# Patient Record
Sex: Female | Born: 1960 | Race: Black or African American | Hispanic: No | Marital: Single | Smoking: Never smoker
Health system: Southern US, Community
[De-identification: ages and names within clinical notes are randomized; demographics above are authoritative.]

---

## 2013-07-26 ENCOUNTER — Emergency Department (HOSPITAL_COMMUNITY): Payer: No Typology Code available for payment source

## 2013-07-26 ENCOUNTER — Emergency Department (HOSPITAL_COMMUNITY)
Admission: EM | Admit: 2013-07-26 | Discharge: 2013-07-26 | Disposition: A | Discharge status: Home / Self Care | Payer: No Typology Code available for payment source | Attending: Emergency Medicine | Admitting: Emergency Medicine

## 2013-07-26 ENCOUNTER — Encounter (HOSPITAL_COMMUNITY): Payer: Self-pay | Admitting: Emergency Medicine

## 2013-07-26 DIAGNOSIS — S0990XA Unspecified injury of head, initial encounter: Secondary | ICD-10-CM | POA: Insufficient documentation

## 2013-07-26 DIAGNOSIS — S161XXA Strain of muscle, fascia and tendon at neck level, initial encounter: Secondary | ICD-10-CM

## 2013-07-26 DIAGNOSIS — Y9389 Activity, other specified: Secondary | ICD-10-CM | POA: Insufficient documentation

## 2013-07-26 DIAGNOSIS — Y92410 Unspecified street and highway as the place of occurrence of the external cause: Secondary | ICD-10-CM | POA: Insufficient documentation

## 2013-07-26 DIAGNOSIS — S139XXA Sprain of joints and ligaments of unspecified parts of neck, initial encounter: Secondary | ICD-10-CM | POA: Insufficient documentation

## 2013-07-26 MED ORDER — NAPROXEN 250 MG PO TABS
500.0000 mg | ORAL_TABLET | Freq: Once | ORAL | Status: AC
  Administered 2013-07-26: 500 mg via ORAL
  Filled 2013-07-26: qty 2

## 2013-07-26 MED ORDER — METHOCARBAMOL 500 MG PO TABS
500.0000 mg | ORAL_TABLET | Freq: Once | ORAL | Status: AC
  Administered 2013-07-26: 500 mg via ORAL
  Filled 2013-07-26: qty 1

## 2013-07-26 MED ORDER — METHOCARBAMOL 500 MG PO TABS: 500.0000 mg | ORAL_TABLET | Freq: Three times a day (TID) | ORAL | Status: AC | PRN

## 2013-07-26 MED ORDER — NAPROXEN 500 MG PO TABS: 500.0000 mg | ORAL_TABLET | Freq: Two times a day (BID) | ORAL | Status: AC
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
## 2013-07-26 NOTE — ED Notes (Signed)
ccollar applied per Dr. Fayrene FearingJames

## 2013-07-26 NOTE — Discharge Instructions (Signed)
Cervical Sprain A cervical sprain is an injury in the neck in which the strong, fibrous tissues (ligaments) that connect your neck bones stretch or tear. Cervical sprains can range from mild to severe. Severe cervical sprains can cause the neck vertebrae to be unstable. This can lead to damage of the spinal cord and can result in serious nervous system problems. The amount of time it takes for a cervical sprain to get better depends on the cause and extent of the injury. Most cervical sprains heal in 1 to 3 weeks. CAUSES  Severe cervical sprains may be caused by:   Contact sport injuries (such as from football, rugby, wrestling, hockey, auto racing, gymnastics, diving, martial arts, or boxing).   Motor vehicle collisions.   Whiplash injuries. This is an injury from a sudden forward-and backward whipping movement of the head and neck.  Falls.  Mild cervical sprains may be caused by:   Being in an awkward position, such as while cradling a telephone between your ear and shoulder.   Sitting in a chair that does not offer proper support.   Working at a poorly designed computer station.   Looking up or down for long periods of time.  SYMPTOMS   Pain, soreness, stiffness, or a burning sensation in the front, back, or sides of the neck. This discomfort may develop immediately after the injury or slowly, 24 hours or more after the injury.   Pain or tenderness directly in the middle of the back of the neck.   Shoulder or upper back pain.   Limited ability to move the neck.   Headache.   Dizziness.   Weakness, numbness, or tingling in the hands or arms.   Muscle spasms.   Difficulty swallowing or chewing.   Tenderness and swelling of the neck.  DIAGNOSIS  Most of the time your health care provider can diagnose a cervical sprain by taking your history and doing a physical exam. Your health care provider will ask about previous neck injuries and any known neck  problems, such as arthritis in the neck. X-rays may be taken to find out if there are any other problems, such as with the bones of the neck. Other tests, such as a CT scan or MRI, may also be needed.  TREATMENT  Treatment depends on the severity of the cervical sprain. Mild sprains can be treated with rest, keeping the neck in place (immobilization), and pain medicines. Severe cervical sprains are immediately immobilized. Further treatment is done to help with pain, muscle spasms, and other symptoms and may include:  Medicines, such as pain relievers, numbing medicines, or muscle relaxants.   Physical therapy. This may involve stretching exercises, strengthening exercises, and posture training. Exercises and improved posture can help stabilize the neck, strengthen muscles, and help stop symptoms from returning.  HOME CARE INSTRUCTIONS   Put ice on the injured area.   Put ice in a plastic bag.   Place a towel between your skin and the bag.   Leave the ice on for 15 20 minutes, 3 4 times a day.   If your injury was severe, you may have been given a cervical collar to wear. A cervical collar is a two-piece collar designed to keep your neck from moving while it heals.  Do not remove the collar unless instructed by your health care provider.  If you have long hair, keep it outside of the collar.  Ask your health care provider before making any adjustments to your collar.   Minor adjustments may be required over time to improve comfort and reduce pressure on your chin or on the back of your head.  Ifyou are allowed to remove the collar for cleaning or bathing, follow your health care provider's instructions on how to do so safely.  Keep your collar clean by wiping it with mild soap and water and drying it completely. If the collar you have been given includes removable pads, remove them every 1 2 days and hand wash them with soap and water. Allow them to air dry. They should be completely  dry before you wear them in the collar.  If you are allowed to remove the collar for cleaning and bathing, wash and dry the skin of your neck. Check your skin for irritation or sores. If you see any, tell your health care provider.  Do not drive while wearing the collar.   Only take over-the-counter or prescription medicines for pain, discomfort, or fever as directed by your health care provider.   Keep all follow-up appointments as directed by your health care provider.   Keep all physical therapy appointments as directed by your health care provider.   Make any needed adjustments to your workstation to promote good posture.   Avoid positions and activities that make your symptoms worse.   Warm up and stretch before being active to help prevent problems.  SEEK MEDICAL CARE IF:   Your pain is not controlled with medicine.   You are unable to decrease your pain medicine over time as planned.   Your activity level is not improving as expected.  SEEK IMMEDIATE MEDICAL CARE IF:   You develop any bleeding.  You develop stomach upset.  You have signs of an allergic reaction to your medicine.   Your symptoms get worse.   You develop new, unexplained symptoms.   You have numbness, tingling, weakness, or paralysis in any part of your body.  MAKE SURE YOU:   Understand these instructions.  Will watch your condition.  Will get help right away if you are not doing well or get worse. Document Released: 02/13/2007 Document Revised: 02/06/2013 Document Reviewed: 10/24/2012 [employer] Patient Information 2014 [employer].  
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
